# Patient Record
Sex: Male | Born: 1980 | Race: White | Hispanic: No | Marital: Single | Smoking: Never smoker
Health system: Southern US, Community
[De-identification: ages and names within clinical notes are randomized; demographics above are authoritative.]

## PROBLEM LIST (undated history)

## (undated) DIAGNOSIS — J45909 Unspecified asthma, uncomplicated: Secondary | ICD-10-CM

---

## 2003-01-08 ENCOUNTER — Encounter: Payer: Self-pay | Admitting: Emergency Medicine

## 2003-01-08 ENCOUNTER — Emergency Department (HOSPITAL_COMMUNITY): Admission: EM | Admit: 2003-01-08 | Discharge: 2003-01-08 | Payer: Self-pay | Admitting: Emergency Medicine

## 2003-01-13 ENCOUNTER — Emergency Department (HOSPITAL_COMMUNITY): Admission: EM | Admit: 2003-01-13 | Discharge: 2003-01-13 | Payer: Self-pay | Admitting: Emergency Medicine

## 2003-01-23 ENCOUNTER — Emergency Department (HOSPITAL_COMMUNITY): Admission: EM | Admit: 2003-01-23 | Discharge: 2003-01-23 | Payer: Self-pay | Admitting: Emergency Medicine

## 2014-06-19 ENCOUNTER — Emergency Department (HOSPITAL_BASED_OUTPATIENT_CLINIC_OR_DEPARTMENT_OTHER): Payer: Worker's Compensation

## 2014-06-19 ENCOUNTER — Encounter (HOSPITAL_BASED_OUTPATIENT_CLINIC_OR_DEPARTMENT_OTHER): Payer: Self-pay | Admitting: *Deleted

## 2014-06-19 ENCOUNTER — Emergency Department (HOSPITAL_BASED_OUTPATIENT_CLINIC_OR_DEPARTMENT_OTHER)
Admission: EM | Admit: 2014-06-19 | Discharge: 2014-06-19 | Disposition: A | Discharge status: Home / Self Care | Payer: Worker's Compensation | Attending: Emergency Medicine | Admitting: Emergency Medicine

## 2014-06-19 DIAGNOSIS — M549 Dorsalgia, unspecified: Secondary | ICD-10-CM

## 2014-06-19 DIAGNOSIS — M545 Low back pain: Secondary | ICD-10-CM | POA: Insufficient documentation

## 2014-06-19 MED ORDER — DIAZEPAM 5 MG PO TABS
5.0000 mg | ORAL_TABLET | Freq: Once | ORAL | Status: AC
  Administered 2014-06-19: 5 mg via ORAL
  Filled 2014-06-19: qty 1

## 2014-06-19 MED ORDER — IBUPROFEN 800 MG PO TABS: 800.0000 mg | ORAL_TABLET | Freq: Three times a day (TID) | ORAL | Status: DC

## 2014-06-19 MED ORDER — HYDROCODONE-ACETAMINOPHEN 5-325 MG PO TABS: 1.0000 | ORAL_TABLET | Freq: Four times a day (QID) | ORAL | Status: DC | PRN

## 2014-06-19 MED ORDER — DIAZEPAM 5 MG PO TABS: 5.0000 mg | ORAL_TABLET | Freq: Two times a day (BID) | ORAL | Status: DC

## 2014-06-19 NOTE — ED Notes (Signed)
UDS completed 

## 2014-06-19 NOTE — ED Notes (Signed)
Family to bedside.

## 2014-06-19 NOTE — ED Notes (Signed)
Patient transported to X-ray ambulatory with tech. 

## 2014-06-19 NOTE — Discharge Instructions (Signed)
Back Pain, Adult °Low back pain is very common. About 1 in 5 people have back pain. The cause of low back pain is rarely dangerous. The pain often gets better over time. About half of people with a sudden onset of back pain feel better in just 2 weeks. About 8 in 10 people feel better by 6 weeks.  °CAUSES °Some common causes of back pain include: °· Strain of the muscles or ligaments supporting the spine. °· Wear and tear (degeneration) of the spinal discs. °· Arthritis. °· Direct injury to the back. °DIAGNOSIS °Most of the time, the direct cause of low back pain is not known. However, back pain can be treated effectively even when the exact cause of the pain is unknown. Answering your caregiver's questions about your overall health and symptoms is one of the most accurate ways to make sure the cause of your pain is not dangerous. If your caregiver needs more information, he or she may order lab work or imaging tests (X-rays or MRIs). However, even if imaging tests show changes in your back, this usually does not require surgery. °HOME CARE INSTRUCTIONS °For many people, back pain returns. Since low back pain is rarely dangerous, it is often a condition that people can learn to manage on their own.  °· Remain active. It is stressful on the back to sit or stand in one place. Do not sit, drive, or stand in one place for more than 30 minutes at a time. Take short walks on level surfaces as soon as pain allows. Try to increase the length of time you walk each day. °· Do not stay in bed. Resting more than 1 or 2 days can delay your recovery. °· Do not avoid exercise or work. Your body is made to move. It is not dangerous to be active, even though your back may hurt. Your back will likely heal faster if you return to being active before your pain is gone. °· Pay attention to your body when you  bend and lift. Many people have less discomfort when lifting if they bend their knees, keep the load close to their bodies, and  avoid twisting. Often, the most comfortable positions are those that put less stress on your recovering back. °· Find a comfortable position to sleep. Use a firm mattress and lie on your side with your knees slightly bent. If you lie on your back, put a pillow under your knees. °· Only take over-the-counter or prescription medicines as directed by your caregiver. Over-the-counter medicines to reduce pain and inflammation are often the most helpful. Your caregiver may prescribe muscle relaxant drugs. These medicines help dull your pain so you can more quickly return to your normal activities and healthy exercise. °· Put ice on the injured area. °¨ Put ice in a plastic bag. °¨ Place a towel between your skin and the bag. °¨ Leave the ice on for 15-20 minutes, 03-04 times a day for the first 2 to 3 days. After that, ice and heat may be alternated to reduce pain and spasms. °· Ask your caregiver about trying back exercises and gentle massage. This may be of some benefit. °· Avoid feeling anxious or stressed. Stress increases muscle tension and can worsen back pain. It is important to recognize when you are anxious or stressed and learn ways to manage it. Exercise is a great option. °SEEK MEDICAL CARE IF: °· You have pain that is not relieved with rest or medicine. °· You have pain that does not improve in 1 week. °· You have new symptoms. °· You are generally not feeling well. °SEEK   IMMEDIATE MEDICAL CARE IF:  °· You have pain that radiates from your back into your legs. °· You develop new bowel or bladder control problems. °· You have unusual weakness or numbness in your arms or legs. °· You develop nausea or vomiting. °· You develop abdominal pain. °· You feel faint. °Document Released: 05/19/2005 Document Revised: 11/18/2011 Document Reviewed: 09/20/2013 °ExitCare® Patient Information ©2015 [employer]. This information is not intended to replace advice given to you by your health care provider. Make sure you  discuss any questions you have with your health care provider. ° ° ° ° °Emergency Department Resource Guide °1) Find a Doctor and Pay Out of Pocket °Although you won't have to find out who is covered by your insurance plan, it is a good idea to ask around and get recommendations. You will then need to call the office and see if the doctor you have chosen will accept you as a new patient and what types of options they offer for patients who are self-pay. Some doctors offer discounts or will set up payment plans for their patients who do not have insurance, but you will need to ask so you aren't surprised when you get to your appointment. ° °2) Contact Your Local Health Department °Not all health departments have doctors that can see patients for sick visits, but many do, so it is worth a call to see if yours does. If you don't know where your local health department is, you can check in your phone book. The CDC also has a tool to help you locate your state's health department, and many state websites also have listings of all of their local health departments. ° °3) Find a Walk-in Clinic °If your illness is not likely to be very severe or complicated, you may want to try a walk in clinic. These are popping up all over the country in pharmacies, drugstores, and shopping centers. They're usually staffed by nurse practitioners or physician assistants that have been trained to treat common illnesses and complaints. They're usually fairly quick and inexpensive. However, if you have serious medical issues or chronic medical problems, these are probably not your best option. ° °No Primary Care Doctor: °- Call [facility] at  832-8000 - they can help you locate a primary care doctor that  accepts your insurance, provides certain services, etc. °- Physician Referral Service- 1-800-533-3463 ° °Chronic Pain Problems: °Organization         Address  Phone   Notes  °[facility]  (336) 297-2271 Patients need  to be referred by their primary care doctor.  ° °Medication Assistance: °Organization         Address  Phone   Notes  °[place] [facility] [address] °[place], [place] [address] (336) 641-8030 --Must be a resident of [place] °-- Must have NO insurance coverage whatsoever (no Medicaid/ Medicare, etc.) °-- The pt. MUST have a primary care doctor that directs their care regularly and follows them in the community °  °[facility]  (866) 331-1348   °[employer]  (888) 892-1162   ° °Agencies that provide inexpensive medical care: °Organization         Address  Phone   Notes  °[facility]  (336) 832-8035   °[facility]    (336) 832-7272   °[facility] [address] °[place], [place] [address] (336) 832-4777   °[facility] [address], °[place] (336) 271-4999   °[facility]    (336) 373-0678   °[facility]    (336) 272-1050   °[facility] ° [address], [place] Phone:  (336) 832-4444, Fax:  (336) 832-4440 Hours of Operation:  9 am - 6 pm, M-F.  Also accepts Medicaid/Medicare and self-pay.  °[facility] ° [address], [place] Phone: (336) 832-3150, Fax: (336) 832-3151. Hours of Operation:  8:30 am - 5:30 pm, M-F.  Also accepts Medicaid and self-pay.  °[facility] [place] [address], [place] Phone: (336) 878-6027   °[facility] [address], [place], [place] (336)723-1848, Ext. 123 Mondays & Thursdays: 7-9 AM.  First 15 patients are seen on a first come, first serve basis. °  ° °Medicaid-accepting [place] Providers: ° °Organization         Address  Phone   Notes  °[facility] [address], [place] (336) 641-2100 Also accepts self-pay patients.  °[facility] [address], [place] ° (336) 856-9996   °[facility] [address], [place] (336) 288-8857   °[facility] [address] [place] [address], [place] (336) 299-7000   °Veita Bland [address], [place]  ° (336) 373-1557 Only accepts [place] Access Medicaid patients after they have their name applied to their card.  ° °Self-Pay (no insurance) in [place]: ° °Organization         Address  Phone   Notes  °Sickle Cell Patients, [facility] [address], [place] (336) 832-1970   °[facility] [address], [place] (336) 832-4400   °[facility] [place] ° [address], [place] (336) 992-4800   °[facility]/Dr. Osei-Bonsu ° [address] [place] [address], [place] or [address], [place] (336) 841-8500 Phone number for both [place] and [place] locations is the same.  °[facility] [address], [place] (336) 299-0000   °[facility] [address] [place] [address], [place] or [address] (336) 852-7530 °(336) 878-2260   °[facility] [address], [place] (336) 350-1642, phone; (336) 294-5005, fax Sees patients 1st and 3rd Saturday of every month.  Must not qualify for public or private insurance (i.e. Medicaid, Medicare, [place] Health Choice, Veterans' Benefits) • Household income should be no more than 200% of the poverty level •The clinic cannot treat you if you are pregnant or think you are pregnant • Sexually transmitted diseases are not treated at the clinic.  ° ° °Dental Care: °Organization         Address  Phone  Notes  °[facility] [address], [place] (336) 641-6152 Accepts children up to age 21 who are enrolled in Medicaid or [place] Health Choice; pregnant women with a Medicaid card; and children who have applied for Medicaid or [place] Health Choice, but were declined, whose  parents can pay a reduced fee at time of service.  °[facility] [place]  [address], [place] (336) 641-7733 Accepts children up to age 21 who are enrolled in Medicaid or [place] Health Choice; pregnant women with a Medicaid card; and children who have applied for Medicaid   or [place] Health Choice, but were declined, whose parents can pay a reduced fee at time of service.  °[facility] ° [address], [place] (336) 641-4533 Patients are seen by appointment only. Walk-ins are not accepted. [facility] will see patients 18 years of age and older. °Monday - Tuesday (8am-5pm) °Most Wednesdays (8:30-5pm) °$30 per visit, cash only  °[facility] ° [address], [place] (336) 641-4533 Patients are seen by appointment only. Walk-ins are not accepted. [facility] will see patients 18 years of age and older. °One Wednesday Evening (Monthly: Volunteer Based).  $30 per visit, cash only  °[facility]  (919) 537-3737 for adults; Children under age 4, call Graduate Pediatric Dentistry at (919) [phone]. Children aged 4-14, please call (919) [phone] to request a pediatric application. ° Dental services are provided in all areas of dental care including fillings, crowns and bridges, complete and partial dentures, implants, gum treatment, root canals, and extractions. Preventive care is also provided. Treatment is provided to both adults and children. °Patients are selected via a lottery and there is often a waiting list. °  °[facility] [address], °[place] ° (336) 763-8833 [email or web address] °  °[facility] [address], [place], [place] (336)723-1848, Ext. 123 Second and Fourth Thursday of each month, opens at 6:30 AM; Clinic ends at 9 AM.  Patients are seen on a first-come first-served basis, and a limited number are seen during each clinic.  ° °[facility] °  [address], [place], [place] (336) 723-7904   Eligibility Requirements °You must have lived in [place], [place], or [place] counties for at least the last three months. °  You cannot be eligible for state or federal sponsored healthcare insurance, including Veterans Administration, Medicaid, or Medicare. °  You generally cannot be eligible for healthcare insurance through your employer.  °  How to apply: °Eligibility screenings are held every Tuesday and Wednesday afternoon from 1:00 pm until 4:00 pm. You do not need an appointment for the interview!  °[facility] [address], [place], [place] 336-631-2330   °[facility]  336-342-8273   °[facility]  336-703-3100   °[facility]  336-570-6415   ° °Behavioral Health Resources in the Community: °Intensive Outpatient Programs °Organization         Address  Phone  Notes  °[facility] [address], [place], [place] 336-878-6098   °[facility] [address], [place], [place] 336-832-9800   °[facility] [address], [place], [place] ° 336-882-2125   °[facility] [address],  °[place], [place] 1-800-853-5163 or 336-641-4981   °Substance Abuse Resources °Organization         Address  Phone  Notes  °[facility]  336-882-2125   °[facility]  336-784-9470   °[facility]  336-285-9073   °[facility]  336-845-3988   °Residential & Outpatient Substance Abuse Program  1-800-659-3381   °Psychological Services °Organization         Address  Phone  Notes  °[facility]  336- 832-9600   °[facility]  336- 378-7881   °[facility] [address], [place] 1-800-853-5163 or 336-641-4981   ° °Mobile Crisis Teams °Organization         Address  Phone    Notes  °[facility], Mobile Crisis Care Unit  1-877-626-1772     °Assertive °[facility] ° [address] [place], [place] 336-834-9664   °Sharon DeEsch [address] °[place] 336-554-5454   ° °Self-Help/Support Groups °Organization         Address  Phone             Notes  °[facility] - variety of support groups  336- 373-1402 Call for more information  °Narcotics Anonymous (NA), [facility] [address], °[place]  2 meetings at this location  ° °Residential Treatment Programs °Organization         Address  Phone  Notes  °[facility] [address],    °[place]  1-866-801-8205   °[facility] ° [address], [place], [place] 704-293-8524   °[facility] [address], [place] 336-845-3988 Admissions: 8am-3pm M-F  °[facility] [address]    °[place], [place] 336-841-1104   °[facility] [address], [place], [place] 336-379-7146   °[facility] [address]  °[place], [place] 336-285-9073   °[facility] Intensive Outpatient [address], [place], [place] 336-852-3033   °[facility] [address]  °[place], [place] 1-877-615-2722 or 336-784-9470   °[facility] [address] [place], [place] 336-227-7417 Accepts Medicaid  °[facility] [address]  °[place] 1-800-659-3381 Substance Abuse/Addiction Treatment  ° °[place] Behavioral Health Resources °Organization         Address  Phone  Notes  °[facility]  (888) 581-9988   °Julie Brannon, PhD [address] [place], [place]   (336) 349-5553 or (336) 951-0000   °[facility]   [address] °[place], [place] (336) 349-4454   °[facility] [address], [place], [place] (336) 342-8316 Insurance/Medicaid/sponsorship through [facility]  °[facility] [address]                                     [place], [place] (336) 342-8316 Therapy/tele-psych/case  °[facility] [address]  ° [place],  (336) 349-2233    °Dr. Arfeen  (336) 349-4544   °[facility] [place]  [employer] [facility] [place] [facility] 1) [address], [place] °2) [address], [place] °3)  [address], [place] (336) 349-3220 °(336) 342-7768 ° °(336) 342-8140   °[place] [facility] (336) 342-1394 or (336) 342-3537 (After Hours)    ° ° ° ° °

## 2014-06-19 NOTE — ED Notes (Signed)
Back injury. He was lifting a ladder at work and felt a pinch in his lower back this afternoon. workmans comp.

## 2014-06-19 NOTE — ED Provider Notes (Signed)
CSN: [id number]     Arrival date & time 06/19/14  1821 History   First MD Initiated Contact with Patient 06/19/14 1933     Chief Complaint  Patient presents with  . Back Injury   HPI  Patient is a 34 year old male who presents emergency room for evaluation of back pain. Patient states that he was bending over to pick up a ladder at work when he developed a sharp pinching pain in his midline lower back. He denies any radiation of pain. He states his pain is currently an 8 out of 10. Patient denies history of back problems. Patient denies history of frequent fractures, IV drug use, cancer, osteoporosis, or back surgeries. Patient denies saddle anesthesias, loss of bowel or bladder, urinary difficulty, tingling or numbness. Patient has taken 2 Aleve with no relief. Patient states that aggravating factors include bending forward and twisting.  History reviewed. No pertinent past medical history. History reviewed. No pertinent past surgical history. No family history on file. History  Substance Use Topics  . Smoking status: Never Smoker   . Smokeless tobacco: Not on file  . Alcohol Use: Yes    Review of Systems  Constitutional: Negative for fever, chills and fatigue.  Gastrointestinal: Negative for abdominal pain.  Genitourinary: Negative for dysuria, urgency, frequency, hematuria, difficulty urinating and penile pain.  Musculoskeletal: Positive for back pain. Negative for gait problem.  Neurological: Negative for numbness.  All other systems reviewed and are negative.     Allergies  Review of patient's allergies indicates no known allergies.  Home Medications   Prior to Admission medications   Medication Sig Start Date End Date Taking? Authorizing Provider  diazepam (VALIUM) 5 MG tablet Take 1 tablet (5 mg total) by mouth 2 (two) times daily. 06/19/14   Cartel Mauss A Forcucci, PA-C  HYDROcodone-acetaminophen (NORCO/VICODIN) 5-325 MG per tablet Take 1 tablet by mouth every 6 (six) hours  as needed for moderate pain or severe pain. 06/19/14   Genise Strack A Forcucci, PA-C  ibuprofen (ADVIL,MOTRIN) 800 MG tablet Take 1 tablet (800 mg total) by mouth 3 (three) times daily. 06/19/14   Dyon Rotert A Forcucci, PA-C   BP 120/89 mmHg  Pulse 101  Temp(Src) 98.4 F (36.9 C) (Oral)  Resp 20  Ht 5\' 7"  (1.702 m)  Wt 200 lb (90.719 kg)  BMI 31.32 kg/m2  SpO2 97% Physical Exam  Constitutional: He is oriented to person, place, and time. He appears well-developed and well-nourished. No distress.  HENT:  Head: Normocephalic and atraumatic.  Mouth/Throat: Oropharynx is clear and moist. No oropharyngeal exudate.  Eyes: Conjunctivae and EOM are normal. Pupils are equal, round, and reactive to light. No scleral icterus.  Neck: Normal range of motion. Neck supple. No JVD present. No thyromegaly present.  Cardiovascular: Normal rate, regular rhythm, normal heart sounds and intact distal pulses.  Exam reveals no gallop and no friction rub.   No murmur heard. Pulmonary/Chest: Effort normal and breath sounds normal.  Abdominal: Soft. Bowel sounds are normal. He exhibits no distension and no mass. There is no tenderness. There is no rebound and no guarding.  Musculoskeletal:  Patient rises slowly from sitting to standing.  They walk without an antalgic gait.  There is no evidence of erythema, ecchymosis, or gross deformity.  There is tenderness to palpation over inferior lumbar spine and bilateral paraspinal muscles. There is no tenderness palpation of the buttocks.  Active ROM is mildly limitedwith forward flexion due to pain But is otherwise full.  Sensation to light  touch is intact over all extremities.  Strength is symmetric and equal in all extremities.    Lymphadenopathy:    He has no cervical adenopathy.  Neurological: He is alert and oriented to person, place, and time. He has normal strength. No cranial nerve deficit or sensory deficit. Coordination normal.  Skin: Skin is warm and dry. He is not  diaphoretic.  Psychiatric: He has a normal mood and affect. His behavior is normal. Judgment and thought content normal.  Nursing note and vitals reviewed.   ED Course  Procedures (including critical care time) Labs Review Labs Reviewed - No data to display  Imaging Review Dg Lumbar Spine Complete  06/19/2014   CLINICAL DATA:  Back injury today  EXAM: LUMBAR SPINE - COMPLETE 4+ VIEW  COMPARISON:  None.  FINDINGS: Anatomic alignment. Disc height maintained. Minimal anterior osteophyte formation at the L5 superior endplate. Facets are within normal limits. No definite acute fracture. No compression deformity.  IMPRESSION: No acute bony pathology.   Electronically Signed   By: Maryclare Bean M.D.   On: 06/19/2014 19:57     EKG Interpretation None      MDM   Final diagnoses:  Back pain   Patient is a 34 year old male who presents emergency room for evaluation of back pain which started today. Physical exam reveals no neurological deficits. There is minimal tenderness palpation over the bony spine of the lumbar and bilateral lumbar paraspinal muscles. There are no red flags for cauda equina at this time. Plain film x-ray of the lumbar spine is negative. Suspect that this is muscle spasm versus muscle strain. We'll discharge home with hydrocodone, Valium, and ibuprofen. Patient to follow-up with a PCP of his skin in a week and half to 2 weeks if no improvement. Patient to return immediately for signs of cauda equina including loss of bowel or bladder or genital numbness. Patient states understanding and agreement at this time. Patient is stable for discharge.    Eben Burow, PA-C 06/19/14 2037  Mirian Mo, MD 06/20/14 [id number]

## 2014-09-09 ENCOUNTER — Encounter (HOSPITAL_BASED_OUTPATIENT_CLINIC_OR_DEPARTMENT_OTHER): Payer: Self-pay | Admitting: Emergency Medicine

## 2014-09-09 ENCOUNTER — Emergency Department (HOSPITAL_BASED_OUTPATIENT_CLINIC_OR_DEPARTMENT_OTHER)
Admission: EM | Admit: 2014-09-09 | Discharge: 2014-09-09 | Disposition: A | Discharge status: Home / Self Care | Payer: BLUE CROSS/BLUE SHIELD | Attending: Emergency Medicine | Admitting: Emergency Medicine

## 2014-09-09 DIAGNOSIS — R05 Cough: Secondary | ICD-10-CM | POA: Diagnosis present

## 2014-09-09 DIAGNOSIS — Z79899 Other long term (current) drug therapy: Secondary | ICD-10-CM | POA: Diagnosis not present

## 2014-09-09 DIAGNOSIS — J45901 Unspecified asthma with (acute) exacerbation: Secondary | ICD-10-CM | POA: Diagnosis not present

## 2014-09-09 HISTORY — DX: Unspecified asthma, uncomplicated: J45.909

## 2014-09-09 MED ORDER — PREDNISONE 20 MG PO TABS: ORAL_TABLET | ORAL | Status: DC

## 2014-09-09 MED ORDER — PREDNISONE 50 MG PO TABS
60.0000 mg | ORAL_TABLET | Freq: Once | ORAL | Status: AC
  Administered 2014-09-09: 60 mg via ORAL
  Filled 2014-09-09 (×2): qty 1

## 2014-09-09 MED ORDER — ALBUTEROL SULFATE (2.5 MG/3ML) 0.083% IN NEBU
5.0000 mg | INHALATION_SOLUTION | Freq: Once | RESPIRATORY_TRACT | Status: AC
  Administered 2014-09-09: 5 mg via RESPIRATORY_TRACT
  Filled 2014-09-09: qty 6

## 2014-09-09 MED ORDER — ALBUTEROL SULFATE HFA 108 (90 BASE) MCG/ACT IN AERS: 1.0000 | INHALATION_SPRAY | Freq: Four times a day (QID) | RESPIRATORY_TRACT | Status: DC | PRN

## 2014-09-09 NOTE — Discharge Instructions (Signed)

## 2014-09-09 NOTE — ED Provider Notes (Signed)
CSN: [id number]     Arrival date & time 09/09/14  0121 History   First MD Initiated Contact with Patient 09/09/14 0125     Chief Complaint  Patient presents with  . Cough     (Consider location/radiation/quality/duration/timing/severity/associated sxs/prior Treatment) Patient is a 34 y.o. male presenting with cough. The history is provided by the patient.  Cough Cough characteristics:  Non-productive Severity:  Moderate Onset quality:  Gradual Timing:  Constant Progression:  Unchanged Chronicity:  Recurrent Smoker: no   Context: exposure to allergens   Relieved by:  Nothing Worsened by:  Nothing tried Ineffective treatments:  None tried Associated symptoms: wheezing   Associated symptoms: no chest pain and no fever   Risk factors: no recent travel   History of allergies and asthma with whee cough and no inhaler  Past Medical History  Diagnosis Date  . Asthma    History reviewed. No pertinent past surgical history. History reviewed. No pertinent family history. History  Substance Use Topics  . Smoking status: Never Smoker   . Smokeless tobacco: Not on file  . Alcohol Use: Yes    Review of Systems  Constitutional: Negative for fever.  Respiratory: Positive for cough and wheezing.   Cardiovascular: Negative for chest pain, palpitations and leg swelling.  All other systems reviewed and are negative.     Allergies  Review of patient's allergies indicates no known allergies.  Home Medications   Prior to Admission medications   Medication Sig Start Date End Date Taking? Authorizing Provider  diazepam (VALIUM) 5 MG tablet Take 1 tablet (5 mg total) by mouth 2 (two) times daily. 06/19/14   Courtney Forcucci, PA-C  HYDROcodone-acetaminophen (NORCO/VICODIN) 5-325 MG per tablet Take 1 tablet by mouth every 6 (six) hours as needed for moderate pain or severe pain. 06/19/14   Courtney Forcucci, PA-C  ibuprofen (ADVIL,MOTRIN) 800 MG tablet Take 1 tablet (800 mg total) by  mouth 3 (three) times daily. 06/19/14   Courtney Forcucci, PA-C   BP 150/97 mmHg  Pulse 73  Temp(Src) 98.3 F (36.8 C) (Oral)  Resp 12  Wt 210 lb (95.255 kg)  SpO2 95% Physical Exam  Constitutional: He is oriented to person, place, and time. He appears well-developed and well-nourished. No distress.  HENT:  Head: Normocephalic and atraumatic.  Mouth/Throat: Oropharynx is clear and moist.  Eyes: Conjunctivae are normal. Pupils are equal, round, and reactive to light.  Neck: Normal range of motion. Neck supple.  Cardiovascular: Normal rate, regular rhythm and intact distal pulses.   Pulmonary/Chest: Effort normal and breath sounds normal. No respiratory distress. He has no wheezes. He has no rales.  Already on treatment  Abdominal: Soft. Bowel sounds are normal. There is no tenderness. There is no rebound and no guarding.  Musculoskeletal: Normal range of motion.  Neurological: He is alert and oriented to person, place, and time.  Skin: Skin is warm and dry.  Psychiatric: He has a normal mood and affect.    ED Course  Procedures (including critical care time) Labs Review Labs Reviewed - No data to display  Imaging Review No results found.   EKG Interpretation None      MDM   Final diagnoses:  None    RX for steroids and a new inhaler    Dreden Rivere, MD 09/09/14 [phone]

## 2014-09-09 NOTE — ED Notes (Signed)
C/o cough, congestion x 2-3 days

## 2014-09-09 NOTE — ED Notes (Signed)
Patient states that he is having SOB with cough x 2 --3 days. HX of asthma and seasonal allergies

## 2015-01-24 ENCOUNTER — Encounter (HOSPITAL_BASED_OUTPATIENT_CLINIC_OR_DEPARTMENT_OTHER): Payer: Self-pay

## 2015-01-24 ENCOUNTER — Emergency Department (HOSPITAL_BASED_OUTPATIENT_CLINIC_OR_DEPARTMENT_OTHER): Payer: BLUE CROSS/BLUE SHIELD

## 2015-01-24 ENCOUNTER — Emergency Department (HOSPITAL_BASED_OUTPATIENT_CLINIC_OR_DEPARTMENT_OTHER)
Admission: EM | Admit: 2015-01-24 | Discharge: 2015-01-24 | Disposition: A | Discharge status: Home / Self Care | Payer: BLUE CROSS/BLUE SHIELD | Attending: Emergency Medicine | Admitting: Emergency Medicine

## 2015-01-24 DIAGNOSIS — W231XXA Caught, crushed, jammed, or pinched between stationary objects, initial encounter: Secondary | ICD-10-CM | POA: Diagnosis not present

## 2015-01-24 DIAGNOSIS — S6991XA Unspecified injury of right wrist, hand and finger(s), initial encounter: Secondary | ICD-10-CM | POA: Diagnosis present

## 2015-01-24 DIAGNOSIS — S61214A Laceration without foreign body of right ring finger without damage to nail, initial encounter: Secondary | ICD-10-CM | POA: Diagnosis not present

## 2015-01-24 DIAGNOSIS — R200 Anesthesia of skin: Secondary | ICD-10-CM | POA: Insufficient documentation

## 2015-01-24 DIAGNOSIS — Z23 Encounter for immunization: Secondary | ICD-10-CM | POA: Insufficient documentation

## 2015-01-24 DIAGNOSIS — Y998 Other external cause status: Secondary | ICD-10-CM | POA: Insufficient documentation

## 2015-01-24 DIAGNOSIS — Y9389 Activity, other specified: Secondary | ICD-10-CM | POA: Insufficient documentation

## 2015-01-24 DIAGNOSIS — S61219A Laceration without foreign body of unspecified finger without damage to nail, initial encounter: Secondary | ICD-10-CM

## 2015-01-24 DIAGNOSIS — Y9289 Other specified places as the place of occurrence of the external cause: Secondary | ICD-10-CM | POA: Insufficient documentation

## 2015-01-24 DIAGNOSIS — R202 Paresthesia of skin: Secondary | ICD-10-CM | POA: Insufficient documentation

## 2015-01-24 DIAGNOSIS — J45909 Unspecified asthma, uncomplicated: Secondary | ICD-10-CM | POA: Insufficient documentation

## 2015-01-24 MED ORDER — LIDOCAINE HCL 2 % IJ SOLN
15.0000 mL | Freq: Once | INTRAMUSCULAR | Status: AC
  Administered 2015-01-24: 20 mg via INTRADERMAL
  Filled 2015-01-24: qty 20

## 2015-01-24 MED ORDER — TETANUS-DIPHTH-ACELL PERTUSSIS 5-2.5-18.5 LF-MCG/0.5 IM SUSP
0.5000 mL | Freq: Once | INTRAMUSCULAR | Status: AC
  Administered 2015-01-24: 0.5 mL via INTRAMUSCULAR
  Filled 2015-01-24: qty 0.5

## 2015-01-24 NOTE — ED Notes (Signed)
Patient transported to X-ray 

## 2015-01-24 NOTE — Discharge Instructions (Signed)
You were evaluated in the ED for your laceration. Your x-ray showed no broken bones. Please return to the ED or other healthcare facility in 10 days to have the sutures removed. Return to ED sooner for worsening symptoms.  Laceration Care, Adult A laceration is a cut or lesion that goes through all layers of the skin and into the tissue just beneath the skin. TREATMENT  Some lacerations may not require closure. Some lacerations may not be able to be closed due to an increased risk of infection. It is important to see your caregiver as soon as possible after an injury to minimize the risk of infection and maximize the opportunity for successful closure. If closure is appropriate, pain medicines may be given, if needed. The wound will be cleaned to help prevent infection. Your caregiver will use stitches (sutures), staples, wound glue (adhesive), or skin adhesive strips to repair the laceration. These tools bring the skin edges together to allow for faster healing and a better cosmetic outcome. However, all wounds will heal with a scar. Once the wound has healed, scarring can be minimized by covering the wound with sunscreen during the day for 1 full year. HOME CARE INSTRUCTIONS  For sutures or staples:  Keep the wound clean and dry.  If you were given a bandage (dressing), you should change it at least once a day. Also, change the dressing if it becomes wet or dirty, or as directed by your caregiver.  Wash the wound with soap and water 2 times a day. Rinse the wound off with water to remove all soap. Pat the wound dry with a clean towel.  After cleaning, apply a thin layer of the antibiotic ointment as recommended by your caregiver. This will help prevent infection and keep the dressing from sticking.  You may shower as usual after the first 24 hours. Do not soak the wound in water until the sutures are removed.  Only take over-the-counter or prescription medicines for pain, discomfort, or fever  as directed by your caregiver.  Get your sutures or staples removed as directed by your caregiver. For skin adhesive strips:  Keep the wound clean and dry.  Do not get the skin adhesive strips wet. You may bathe carefully, using caution to keep the wound dry.  If the wound gets wet, pat it dry with a clean towel.  Skin adhesive strips will fall off on their own. You may trim the strips as the wound heals. Do not remove skin adhesive strips that are still stuck to the wound. They will fall off in time. For wound adhesive:  You may briefly wet your wound in the shower or bath. Do not soak or scrub the wound. Do not swim. Avoid periods of heavy perspiration until the skin adhesive has fallen off on its own. After showering or bathing, gently pat the wound dry with a clean towel.  Do not apply liquid medicine, cream medicine, or ointment medicine to your wound while the skin adhesive is in place. This may loosen the film before your wound is healed.  If a dressing is placed over the wound, be careful not to apply tape directly over the skin adhesive. This may cause the adhesive to be pulled off before the wound is healed.  Avoid prolonged exposure to sunlight or tanning lamps while the skin adhesive is in place. Exposure to ultraviolet light in the first year will darken the scar.  The skin adhesive will usually remain in place for 5 to  10 days, then naturally fall off the skin. Do not pick at the adhesive film. You may need a tetanus shot if:  You cannot remember when you had your last tetanus shot.  You have never had a tetanus shot. If you get a tetanus shot, your arm may swell, get red, and feel warm to the touch. This is common and not a problem. If you need a tetanus shot and you choose not to have one, there is a rare chance of getting tetanus. Sickness from tetanus can be serious. SEEK MEDICAL CARE IF:   You have redness, swelling, or increasing pain in the wound.  You see a red  line that goes away from the wound.  You have yellowish-white fluid (pus) coming from the wound.  You have a fever.  You notice a bad smell coming from the wound or dressing.  Your wound breaks open before or after sutures have been removed.  You notice something coming out of the wound such as wood or glass.  Your wound is on your hand or foot and you cannot move a finger or toe. SEEK IMMEDIATE MEDICAL CARE IF:   Your pain is not controlled with prescribed medicine.  You have severe swelling around the wound causing pain and numbness or a change in color in your arm, hand, leg, or foot.  Your wound splits open and starts bleeding.  You have worsening numbness, weakness, or loss of function of any joint around or beyond the wound.  You develop painful lumps near the wound or on the skin anywhere on your body. MAKE SURE YOU:   Understand these instructions.  Will watch your condition.  Will get help right away if you are not doing well or get worse. Document Released: 05/19/2005 Document Revised: 08/11/2011 Document Reviewed: 11/12/2010 [facility] Patient Information 2015 [place], [place]. This information is not intended to replace advice given to you by your health care provider. Make sure you discuss any questions you have with your health care provider.

## 2015-01-24 NOTE — ED Notes (Signed)
Right ring finger injury at work today-finger was caught between 2 pieces of metal-lac noted-bleeding controlled-gauze dsh applied

## 2015-01-24 NOTE — ED Provider Notes (Signed)
CSN: [id number]     Arrival date & time 01/24/15  1835 History   First MD Initiated Contact with Patient 01/24/15 1919     Chief Complaint  Patient presents with  . Finger Injury     (Consider location/radiation/quality/duration/timing/severity/associated sxs/prior Treatment) HPI Jeremy Weiss is a 34 y.o. male who comes in for evaluation of finger injury. At approximately 6 PM, patient got his right ring finger caught in between 2 pieces of metal at work. Denies any crush injury. Reports mild numbness and tingling now to the area. Denies any decreased range of motion. Rates stinging discomfort as a 4/10. Did not do anything to improve his symptoms. Movement worsens the discomfort. Last tetanus unknown.  Past Medical History  Diagnosis Date  . Asthma    History reviewed. No pertinent past surgical history. No family history on file. Social History  Substance Use Topics  . Smoking status: Never Smoker   . Smokeless tobacco: None  . Alcohol Use: No    Review of Systems A 10 point review of systems was completed and was negative except for pertinent positives and negatives as mentioned in the history of present illness     Allergies  Review of patient's allergies indicates no known allergies.  Home Medications   Prior to Admission medications   Not on File   BP 123/73 mmHg  Pulse 80  Temp(Src) 98.9 F (37.2 C) (Oral)  Resp 18  Ht  (1.702 m)  Wt 200 lb (90.719 kg)  BMI 31.32 kg/m2 Physical Exam  Constitutional:  Awake, alert, nontoxic appearance.  HENT:  Head: Atraumatic.  Eyes: Right eye exhibits no discharge. Left eye exhibits no discharge.  Neck: Neck supple.  Pulmonary/Chest: Effort normal. He exhibits no tenderness.  Abdominal: Soft. There is no tenderness. There is no rebound.  Musculoskeletal: He exhibits no tenderness.  Baseline ROM, no obvious new focal weakness.  Neurological:  Mental status and motor strength appears baseline for patient and  situation.  Skin: No rash noted.  Small, 2 cm oblique laceration noted to the dorsum of right middle finger near DIP. No joint involvement. Patient is able to flex and extend digits against resistance without difficulty. Sensation intact.  Psychiatric: He has a normal mood and affect.  Nursing note and vitals reviewed.   ED Course  Procedures (including critical care time)  LACERATION REPAIR Performed by: Sharlene Motts Authorized by: Sharlene Motts Consent: Verbal consent obtained. Risks and benefits: risks, benefits and alternatives were discussed Consent given by: patient Patient identity confirmed: provided demographic data Prepped and Draped in normal sterile fashion Wound explored  Laceration Location: R ring finger  Laceration Length: 2cm  No Foreign Bodies seen or palpated  Anesthesia: local infiltration  Local anesthetic: lidocaine 2% wo epinephrine  Anesthetic total: 3 ml  Irrigation method: syringe Amount of cleaning: standard  Skin closure: 4-0 Prolene  Number of sutures: 4  Technique: SI  Patient tolerance: Patient tolerated the procedure well with no immediate complications.  Labs Review Labs Reviewed - No data to display  Imaging Review Dg Finger Ring Right  01/24/2015   CLINICAL DATA:  Laceration to RIGHT ring finger on metal work tonight  EXAM: RIGHT RING FINGER 2+V  COMPARISON:  None  FINDINGS: Osseous mineralization normal.  Joint spaces preserved.  No fracture, dislocation, or bone destruction.  No radiopaque foreign body identified.  IMPRESSION: No acute osseous abnormalities.   Electronically Signed   By: Ulyses Southward M.D.   On: 01/24/2015  19:21   I have personally reviewed and evaluated these images and lab results as part of my medical decision-making.   EKG Interpretation None     Meds given in ED:  Medications  Tdap (BOOSTRIX) injection 0.5 mL (0.5 mLs Intramuscular Given 01/24/15 2002)  lidocaine (XYLOCAINE) 2 % (with  pres) injection 300 mg (20 mg Intradermal Given 01/24/15 2001)    There are no discharge medications for this patient.  Filed Vitals:   01/24/15 1854  BP: 123/73  Pulse: 80  Temp: 98.9 F (37.2 C)  TempSrc: Oral  Resp: 18  Height: 5\' 7"  (1.702 m)  Weight: 200 lb (90.719 kg)    MDM  Vitals stable - WNL -afebrile Pt resting comfortably in ED. PE--NVI. No joint or tendon involvement. Flex and extend against resistance Labwork--Tdap updated in ED. Imaging--Plain films negative for frx, FB or dislocation. Lac repair at bedside, 4 sutures Return in 10-12 days for suture removal. I discussed all relevant lab findings and imaging results with pt and they verbalized understanding. Discussed f/u with PCP within 48 hrs and return precautions, pt very amenable to plan.  Final diagnoses:  Finger laceration, initial encounter      Joycie Peek, PA-C 01/25/15 1211  Linwood Dibbles, MD 01/26/15 [phone]

## 2015-07-13 IMAGING — CR DG LUMBAR SPINE COMPLETE 4+V
5 series · 5 of 5 positions shown · non-contrast
Comparison: None.

CLINICAL DATA: Back injury today

EXAM:
LUMBAR SPINE - COMPLETE 4+ VIEW

[t l-spine a.p.]
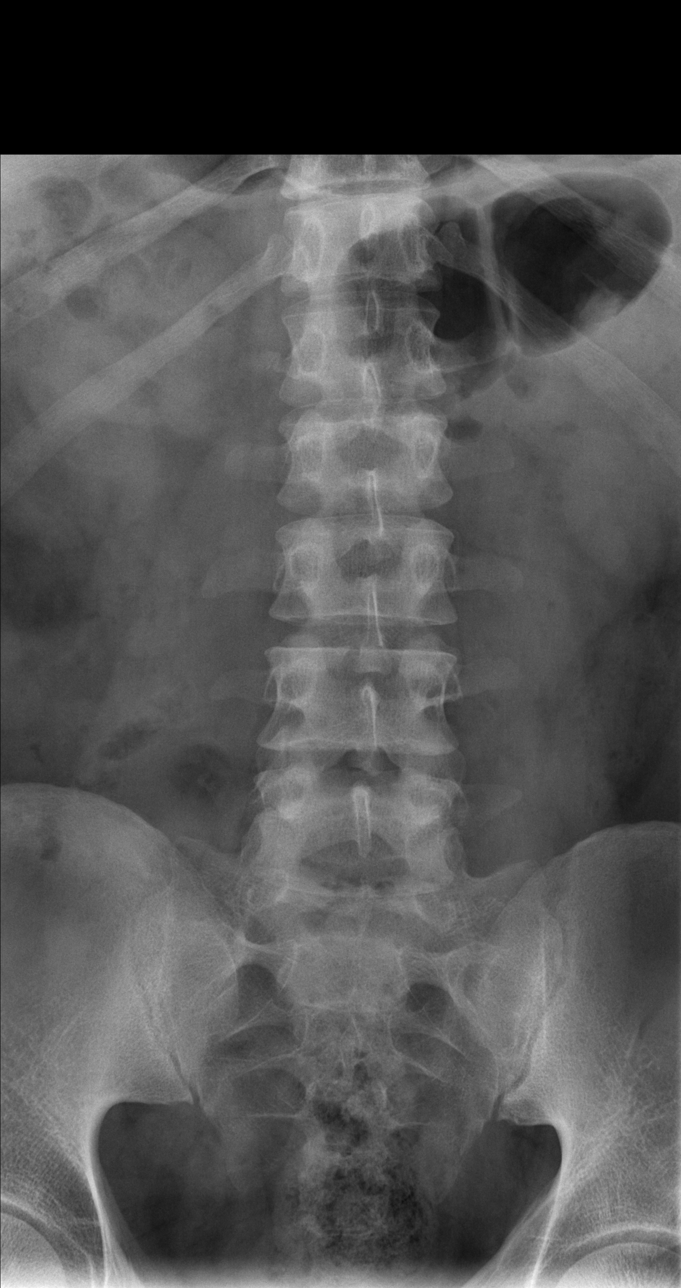

[t l-spine oblique exposure (1 of 2)]
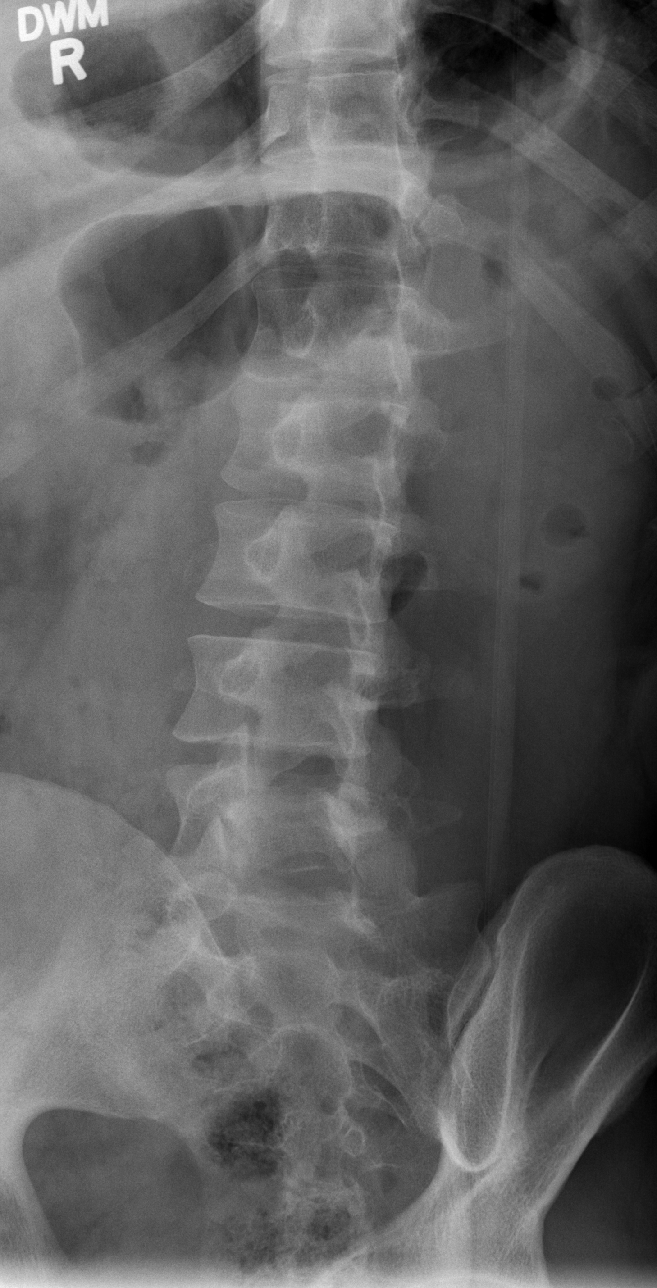

[t l-spine oblique exposure (2 of 2)]
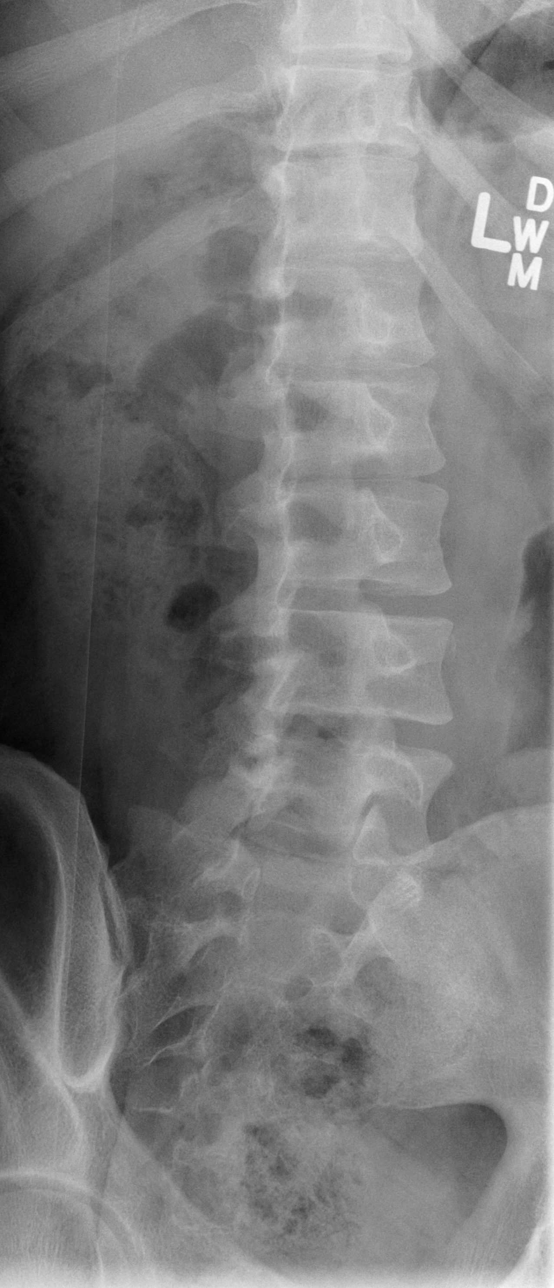

[t l-spine lat]
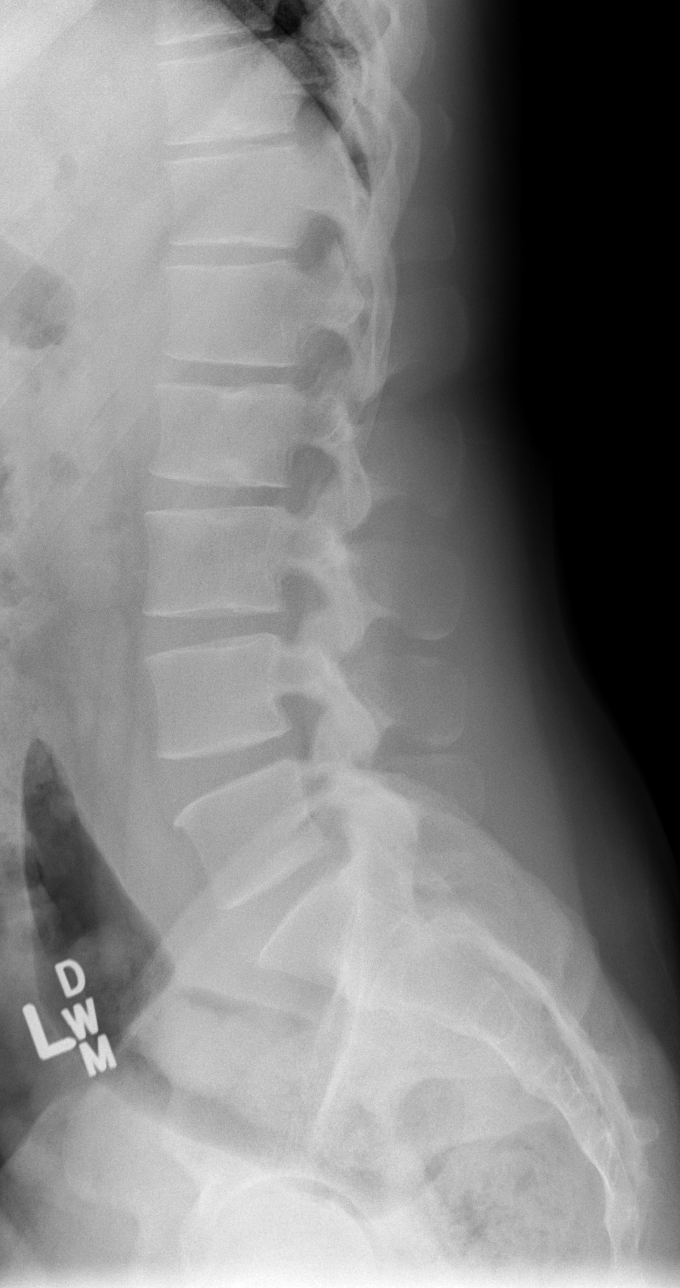

[t l-spine l5-s1 spot]
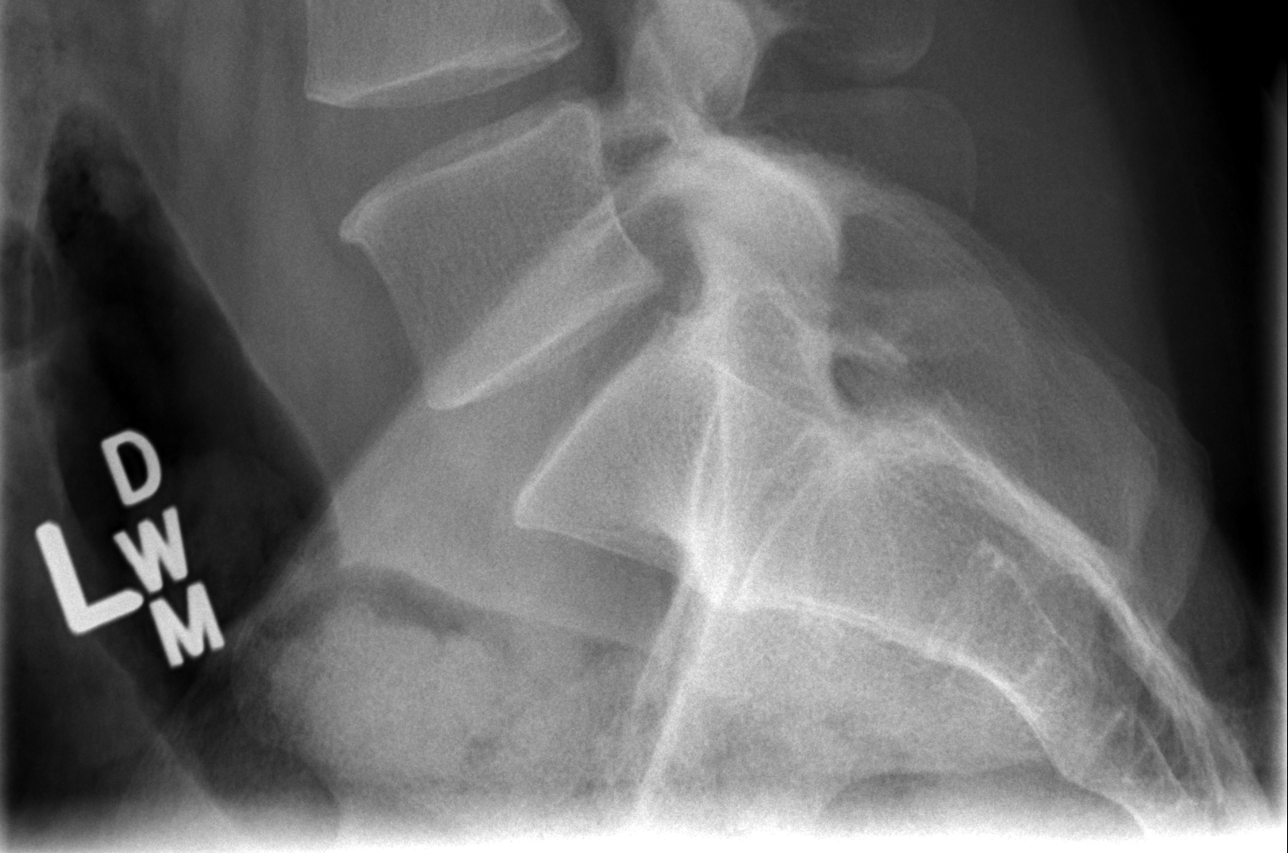

[5 of 5 positions shown; findings below may reference images not displayed]

FINDINGS: Anatomic alignment. Disc height maintained. Minimal anterior
osteophyte formation at the L5 superior endplate. Facets are within
normal limits. No definite acute fracture. No compression deformity.
IMPRESSION: No acute bony pathology.
